# Patient Record
Sex: Female | Born: 2000 | Race: White | Hispanic: No | Marital: Single | Smoking: Never smoker
Health system: Southern US, Community
[De-identification: ages and names within clinical notes are randomized; demographics above are authoritative.]

---
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
## 2019-03-04 ENCOUNTER — Other Ambulatory Visit: Payer: Self-pay

## 2019-03-04 ENCOUNTER — Encounter: Payer: Self-pay | Admitting: Physician Assistant

## 2019-03-04 ENCOUNTER — Ambulatory Visit (LOCAL_COMMUNITY_HEALTH_CENTER): Payer: Self-pay | Admitting: Physician Assistant

## 2019-03-04 VITALS — BP 123/80 | Ht 63.0 in | Wt 106.0 lb

## 2019-03-04 DIAGNOSIS — Z30011 Encounter for initial prescription of contraceptive pills: Secondary | ICD-10-CM

## 2019-03-04 DIAGNOSIS — Z113 Encounter for screening for infections with a predominantly sexual mode of transmission: Secondary | ICD-10-CM

## 2019-03-04 DIAGNOSIS — Z3009 Encounter for other general counseling and advice on contraception: Secondary | ICD-10-CM

## 2019-03-04 DIAGNOSIS — Z3041 Encounter for surveillance of contraceptive pills: Secondary | ICD-10-CM

## 2019-03-04 LAB — WET PREP FOR TRICH, YEAST, CLUE
Trichomonas Exam: NEGATIVE
Yeast Exam: NEGATIVE

## 2019-03-04 MED ORDER — NORETHINDRONE ACET-ETHINYL EST 1-20 MG-MCG PO TABS: 1.0000 | ORAL_TABLET | Freq: Every day | ORAL | 0 refills | Status: AC

## 2019-03-04 NOTE — Progress Notes (Signed)
[employer] results reviewed. Per standing orders no treatment indicated. Ocp's given condoms declined. Hal Morales, RN

## 2019-03-04 NOTE — Progress Notes (Signed)
Pt here for STD screening and desires to get back on OCP's. Pt reports last time she used OCP's was back in 09/2018.Ronny Bacon, RN

## 2019-03-05 NOTE — Progress Notes (Signed)
Family Planning Visit-  Subjective:  Molly Hunt is a 18 y.o. being seen today for an well woman visit and to continue with her OCs.    She is currently using OCP (estrogen/progesterone) for pregnancy prevention. Patient reports she does not  want a pregnancy in the next year. Patient  does not have a problem list on file.  Chief Complaint  Patient presents with  . SEXUALLY TRANSMITTED DISEASE    std screening  . Contraception    OCPs    Patient reports that she is doing well with her current OCs and wants to continue with them.  States that she ran out and has been using condoms sometimes since then.  LMP 02/15/2019 and normal.  Last sex 03/01/2019 and without condoms.  Requests STD screening due to having a new partner and finding out that the ex was cheating. Denies any symptoms.   Patient denies any changes to personal and family history since last RP.  Denies any concerns today.   Does the patient desire a pregnancy in the next year? ([facility] flowsheet)  See flowsheet for other program required questions.   Body mass index is 18.78 kg/m. - Patient is eligible for diabetes screening based on BMI and age >26?  not applicable HA1C ordered? not applicable  Patient reports 2 of partners in last year. Desires STI screening?  Yes  Does the patient have a current or past history of drug use? No   No components found for: HCV]   Health Maintenance Due  Topic Date Due  . HIV Screening  04/24/2014  . TETANUS/TDAP  04/24/2018  . INFLUENZA VACCINE  11/30/2018    Review of Systems  All other systems reviewed and are negative.   The following portions of the patient's history were reviewed and updated as appropriate: allergies, current medications, past family history, past medical history, past social history, past surgical history and problem list. Problem list updated.  Objective:   Vitals:   03/04/19 1440  BP: 123/80  Weight: 106 lb (48.1 kg)  Height: 5\' 3"  (1.6 m)     Physical Exam Vitals signs reviewed.  Constitutional:      General: She is not in acute distress.    Appearance: Normal appearance.  HENT:     Head: Normocephalic and atraumatic.     Mouth/Throat:     Mouth: Mucous membranes are moist.     Pharynx: Oropharynx is clear. No oropharyngeal exudate or posterior oropharyngeal erythema.  Eyes:     Conjunctiva/sclera: Conjunctivae normal.  Neck:     Musculoskeletal: Neck supple.  Pulmonary:     Effort: Pulmonary effort is normal.  Abdominal:     Palpations: Abdomen is soft. There is no mass.     Tenderness: There is no abdominal tenderness. There is no guarding or rebound.  Genitourinary:    General: Normal vulva.     Rectum: Normal.     Comments: External genitalia/pubic area without nits, lice, edema, erythema, lesions and inguinal adenopathy. Vagina with normal mucosa and discharge, pH=4.5. Cervix without visible lesions. Uterus firm, mobile, nt, no masses, no CMT, no adnexal tenderness or fullness. Lymphadenopathy:     Cervical: No cervical adenopathy.  Skin:    General: Skin is warm and dry.     Findings: No bruising, erythema, lesion or rash.  Neurological:     Mental Status: She is alert and oriented to person, place, and time.  Psychiatric:        Mood and Affect: Mood  normal.        Behavior: Behavior normal.        Thought Content: Thought content normal.        Judgment: Judgment normal.       Assessment and Plan:  Molly Hunt is a 18 y.o. female presenting to the [facility] Department for an initial well woman exam/family planning visit  Contraception counseling: Reviewed all forms of birth control options available including abstinence; over the counter/barrier methods; hormonal contraceptive medication including pill, patch, ring, injection,contraceptive implant; hormonal and nonhormonal IUDs; permanent sterilization options including vasectomy and the various tubal sterilization modalities.  Risks and benefits reviewed.  Questions were answered.  Written information was also given to the patient to review.  Patient desires to restart OCs, this was prescribed for patient. She will follow up in  1 yr and prn for surveillance.  She was told to call with any further questions, or with any concerns about this method of contraception.  Emphasized use of condoms 100% of the time for STI prevention.  1. Encounter for counseling regarding contraception Reviewed with patient how to restart OCs and that if she restarts today, mid-cycle, she may have some irregular bleeding mid cycle on OC pack. Counseled re:  ECP and patient declines this today. Rec condoms with all sex for first cycle of OCs and enc always for STD protection. - norethindrone-ethinyl estradiol (MICROGESTIN) 1-20 MG-MCG tablet; Take 1 tablet by mouth daily.  Dispense: 13 Package; Refill: 0  2. Screening for STD (sexually transmitted disease) Patient is without symptoms today. Await test results.  Counseled that RN will call if needs to RTC for any treatment once results are back. - WET PREP FOR [place], YEAST, CLUE - Chlamydia/Gonorrhea [facility] - HIV [place] [facility] - Syphilis Serology,  Lab  3. Surveillance of previously prescribed contraceptive pill OK to restart OCs, Microgestin 1/20 Fe #13 1 po daily at the same time. - norethindrone-ethinyl estradiol (MICROGESTIN) 1-20 MG-MCG tablet; Take 1 tablet by mouth daily.  Dispense: 13 Package; Refill: 0     No follow-ups on file.  No future appointments.  Jerene Dilling, PA

## 2020-01-23 ENCOUNTER — Ambulatory Visit: Payer: Self-pay

## 2020-04-29 ENCOUNTER — Other Ambulatory Visit: Payer: Self-pay

## 2020-04-29 ENCOUNTER — Encounter: Payer: Self-pay | Admitting: Obstetrics & Gynecology

## 2020-04-29 ENCOUNTER — Other Ambulatory Visit (HOSPITAL_COMMUNITY)
Admission: RE | Admit: 2020-04-29 | Discharge: 2020-04-29 | Disposition: A | Discharge status: Home / Self Care | Payer: Medicaid Other | Source: Ambulatory Visit | Attending: Obstetrics & Gynecology | Admitting: Obstetrics & Gynecology

## 2020-04-29 ENCOUNTER — Ambulatory Visit (INDEPENDENT_AMBULATORY_CARE_PROVIDER_SITE_OTHER): Payer: 59 | Admitting: Obstetrics & Gynecology

## 2020-04-29 VITALS — BP 100/60 | Ht 62.0 in | Wt 111.0 lb

## 2020-04-29 DIAGNOSIS — Z01419 Encounter for gynecological examination (general) (routine) without abnormal findings: Secondary | ICD-10-CM | POA: Diagnosis not present

## 2020-04-29 DIAGNOSIS — Z124 Encounter for screening for malignant neoplasm of cervix: Secondary | ICD-10-CM

## 2020-04-29 DIAGNOSIS — Z113 Encounter for screening for infections with a predominantly sexual mode of transmission: Secondary | ICD-10-CM | POA: Diagnosis not present

## 2020-04-29 NOTE — Patient Instructions (Addendum)
Tranexamic acid oral tablets What is this medicine? TRANEXAMIC ACID (TRAN ex AM ik AS id) slows down or stops blood clots from being broken down. This medicine is used to treat heavy monthly menstrual bleeding. This medicine may be used for other purposes; ask your health care provider or pharmacist if you have questions. COMMON BRAND NAME(S): Cyklokapron, Lysteda What should I tell my health care provider before I take this medicine? They need to know if you have any of these conditions:  bleeding in the brain  blood clotting problems  kidney disease  vision problems  an unusual allergic reaction to tranexamic acid, other medicines, foods, dyes, or preservatives  pregnant or trying to get pregnant  breast-feeding How should I use this medicine? Take this medicine by mouth with a glass of water. Follow the directions on the prescription label. Do not cut, crush, or chew this medicine. You can take it with or without food. If it upsets your stomach, take it with food. Take your medicine at regular intervals. Do not take it more often than directed. Do not stop taking except on your doctor's advice. Do not take this medicine until your period has started. Do not take it for more than 5 days in a row. Do not take this medicine when you do not have your period. Talk to your pediatrician regarding the use of this medicine in children. While this drug may be prescribed for female children as young as 12 years of age for selected conditions, precautions do apply. Overdosage: If you think you have taken too much of this medicine contact a poison control center or emergency room at once. NOTE: This medicine is only for you. Do not share this medicine with others. What if I miss a dose? If you miss a dose, take it when you remember, and then take your next dose at least 6 hours later. Do not take more than 2 tablets at a time to make up for missed doses. What may interact with this medicine? Do  not take this medicine with any of the following medications:  estrogens  birth control pills, patches, injections, rings or other devices that contain both an estrogen and a progestin This medicine may also interact with the following medications:  certain medicines used to help your blood clot  tretinoin (taken by mouth) This list may not describe all possible interactions. Give your health care provider a list of all the medicines, herbs, non-prescription drugs, or dietary supplements you use. Also tell them if you smoke, drink alcohol, or use illegal drugs. Some items may interact with your medicine. What should I watch for while using this medicine? Tell your doctor or healthcare professional if your symptoms do not start to get better or if they get worse. Tell your doctor or healthcare professional if you notice any eye problems while taking this medicine. Your doctor will refer you to an eye doctor who will examine your eyes. What side effects may I notice from receiving this medicine? Side effects that you should report to your doctor or health care professional as soon as possible:  allergic reactions like skin rash, itching or hives, swelling of the face, lips, or tongue  breathing difficulties  changes in vision  sudden or severe pain in the chest, legs, head, or groin  unusually weak or tired Side effects that usually do not require medical attention (report to your doctor or health care professional if they continue or are bothersome):  back pain    headache  muscle or joint aches  sinus and nasal problems  stomach pain  tiredness This list may not describe all possible side effects. Call your doctor for medical advice about side effects. You may report side effects to FDA at 1-800-FDA-1088. Where should I keep my medicine? Keep out of the reach of children. Store at room temperature between 15 and 30 degrees C (59 and 86 degrees F). Throw away any unused  medicine after the expiration date. NOTE: This sheet is a summary. It may not cover all possible information. If you have questions about this medicine, talk to your doctor, pharmacist, or health care provider.  2020 Elsevier/Gold Standard (2015-05-20 09:12:15)  

## 2020-04-29 NOTE — Progress Notes (Signed)
HPI:      Ms. Atlee Villers is a 19 y.o. No obstetric history on file. who LMP was Patient's last menstrual period was 04/15/2020., she presents today for her annual examination. The patient has no complaints today. The patient is sexually active. Her no prior history of gyn screening tests. The patient does not perform self breast exams.  There is no notable family history of breast or ovarian cancer in her family.  The patient has regular exercise: yes.  The patient denies current symptoms of depression.    GYN History: Contraception: condoms  OCP in past- side effects Does not desire other hormonal contraception Reports irreg and heavy/painful period first day especially    Also assoc w breast T  PMHx: History reviewed. No pertinent past medical history. History reviewed. No pertinent surgical history. Family History  Family history unknown: Yes   Social History   Tobacco Use  . Smoking status: Never Smoker  . Smokeless tobacco: Never Used  Substance Use Topics  . Alcohol use: Yes  . Drug use: Never    Current Outpatient Medications:  .  norethindrone-ethinyl estradiol (MICROGESTIN) 1-20 MG-MCG tablet, Take 1 tablet by mouth daily. (Patient not taking: Reported on 04/29/2020), Disp: 13 Package, Rfl: 0 Allergies: Bee venom and Aspirin  Review of Systems  Constitutional: Negative for chills, fever and malaise/fatigue.  HENT: Negative for congestion, sinus pain and sore throat.   Eyes: Negative for blurred vision and pain.  Respiratory: Negative for cough and wheezing.   Cardiovascular: Negative for chest pain and leg swelling.  Gastrointestinal: Negative for abdominal pain, constipation, diarrhea, heartburn, nausea and vomiting.  Genitourinary: Negative for dysuria, frequency, hematuria and urgency.  Musculoskeletal: Negative for back pain, joint pain, myalgias and neck pain.  Skin: Negative for itching and rash.  Neurological: Negative for dizziness, tremors and weakness.   Endo/Heme/Allergies: Does not bruise/bleed easily.  Psychiatric/Behavioral: Negative for depression. The patient is not nervous/anxious and does not have insomnia.     Objective: BP 100/60   Ht 5\' 2"  (1.575 m)   Wt 111 lb (50.3 kg)   LMP 04/15/2020   BMI 20.30 kg/m   Filed Weights   04/29/20 1434  Weight: 111 lb (50.3 kg)   Body mass index is 20.3 kg/m. Physical Exam Constitutional:      General: She is not in acute distress.    Appearance: She is well-developed and well-nourished.  Genitourinary:     Vagina, uterus and rectum normal.     There is no rash or lesion on the right labia.     There is no rash or lesion on the left labia.    No lesions in the vagina.     No vaginal bleeding.      Right Adnexa: not tender and no mass present.    Left Adnexa: not tender and no mass present.    No cervical motion tenderness, friability, lesion or polyp.     Uterus is mobile.     Uterus is not enlarged.     No uterine mass detected.    Uterus is midaxial.     Pelvic exam was performed with patient supine.  Breasts:     Right: No mass, skin change or tenderness.     Left: No mass, skin change or tenderness.    HENT:     Head: Normocephalic and atraumatic. No laceration.     Right Ear: Hearing normal.     Left Ear: Hearing normal.  Nose: No epistaxis or foreign body.     Mouth/Throat:     Mouth: Oropharynx is clear and moist and mucous membranes are normal.     Pharynx: Uvula midline.  Eyes:     Pupils: Pupils are equal, round, and reactive to light.  Neck:     Thyroid: No thyromegaly.  Cardiovascular:     Rate and Rhythm: Normal rate and regular rhythm.     Heart sounds: No murmur heard. No friction rub. No gallop.   Pulmonary:     Effort: Pulmonary effort is normal. No respiratory distress.     Breath sounds: Normal breath sounds. No wheezing.  Abdominal:     General: Bowel sounds are normal. There is no distension.     Palpations: Abdomen is soft.      Tenderness: There is no abdominal tenderness. There is no rebound.  Musculoskeletal:        General: Normal range of motion.     Cervical back: Normal range of motion and neck supple.  Neurological:     Mental Status: She is alert and oriented to person, place, and time.     Cranial Nerves: No cranial nerve deficit.  Skin:    General: Skin is warm and dry.  Psychiatric:        Mood and Affect: Mood and affect normal.        Judgment: Judgment normal.  Vitals reviewed.     Assessment:  ANNUAL EXAM 1. Women's annual routine gynecological examination   2. Screening for cervical cancer   3. Screen for STD (sexually transmitted disease)      Screening Plan:            1.  Cervical Screening-  Pap smear done today  2.  Labs Ordered today  3.  Heavy painful periods, does not desire hormones. Pros and cons of LYSTEDA discussed.  Concerned also about cost.  Will Rx and see if helps and/or is cost effective.  4. STD screening today, blood work and cultures w PAP  5. Counseling for contraception: condoms  The pregnancy intention screening data noted above was reviewed. Potential methods of contraception were discussed. The patient elected to proceed with Female Condom.      F/U  Return in about 1 year (around 04/29/2021) for Annual.  Annamarie Major, MD, [facility], [facility] 04/29/2020  2:54 PM

## 2020-05-05 LAB — RPR+HSVIGM+HBSAG+HSV2(IGG)+...
HIV Screen 4th Generation wRfx: NONREACTIVE
HSV 2 IgG, Type Spec: <0.91 index (ref 0.00–0.90)
HSVI/II Comb IgM: <0.91 Ratio (ref 0.00–0.90)
Hepatitis B Surface Ag: NEGATIVE
RPR Ser Ql: NONREACTIVE

## 2020-05-09 LAB — CYTOLOGY - PAP
Chlamydia: NEGATIVE
Comment: NEGATIVE
Comment: NEGATIVE
Comment: NEGATIVE
Comment: NORMAL
Diagnosis: UNDETERMINED — AB
High risk HPV: POSITIVE — AB
Neisseria Gonorrhea: NEGATIVE
Trichomonas: NEGATIVE

## 2020-05-11 ENCOUNTER — Telehealth: Payer: Self-pay

## 2020-05-11 NOTE — Telephone Encounter (Signed)
Appointment has been cancelled.

## 2020-05-11 NOTE — Telephone Encounter (Signed)
-----   Message from Nadara Mustard, MD sent at 05/11/2020  7:58 AM EST ----- Sch Colpo w PH.  Abnormal ASCUS and HPV PAP results and message in MyChart to patient w explanation and ned for colpo.

## 2020-05-11 NOTE — Progress Notes (Signed)
Sch Colpo w PH.  Abnormal ASCUS and HPV PAP results and message in MyChart to patient w explanation and ned for colpo.

## 2020-05-11 NOTE — Telephone Encounter (Signed)
Called patient- This was her first pap, ASCUS HPV+, ASCCP guidelines are for her to repeat her pap smear in 1 year. Discussed this with her. She will follow up in 1 year for a pap smear. Please cancel colposcopy appointment. Thank you
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
## 2020-05-28 ENCOUNTER — Ambulatory Visit: Payer: 59 | Admitting: Obstetrics and Gynecology

## 2021-07-22 ENCOUNTER — Emergency Department: Payer: Self-pay

## 2021-07-22 ENCOUNTER — Other Ambulatory Visit: Payer: Self-pay

## 2021-07-22 ENCOUNTER — Emergency Department
Admission: EM | Admit: 2021-07-22 | Discharge: 2021-07-22 | Disposition: A | Discharge status: Home / Self Care | Payer: Self-pay | Attending: Emergency Medicine | Admitting: Emergency Medicine

## 2021-07-22 DIAGNOSIS — W228XXA Striking against or struck by other objects, initial encounter: Secondary | ICD-10-CM | POA: Insufficient documentation

## 2021-07-22 DIAGNOSIS — S62339A Displaced fracture of neck of unspecified metacarpal bone, initial encounter for closed fracture: Secondary | ICD-10-CM

## 2021-07-22 DIAGNOSIS — M79641 Pain in right hand: Secondary | ICD-10-CM | POA: Insufficient documentation

## 2021-07-22 DIAGNOSIS — S62326A Displaced fracture of shaft of fifth metacarpal bone, right hand, initial encounter for closed fracture: Secondary | ICD-10-CM | POA: Insufficient documentation

## 2021-07-22 MED ORDER — HYDROCODONE-ACETAMINOPHEN 5-325 MG PO TABS: 1.0000 | ORAL_TABLET | Freq: Once | ORAL | Status: DC
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
## 2021-07-22 NOTE — ED Notes (Addendum)
RN went into room to discharge pt and pt was not in room. RN was unable to obtain vital signs and complete discharge teaching due to pt leaving.  ?

## 2021-07-22 NOTE — ED Notes (Signed)
Provider placed splint on pt.  ?

## 2021-07-22 NOTE — Discharge Instructions (Addendum)
Keep the splint in place. Apply ice over the dressing to reduce swelling and pain. Take OTC Tylenol and Motrin as needed for pain. Follow-up with Ortho for further fracture care.  ?
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
## 2022-09-28 IMAGING — CR DG HAND COMPLETE 3+V*R*
1 series · 3 of 3 positions shown · non-contrast
Comparison: None.

CLINICAL DATA: Injury, punched a counter, pain and swelling in
right hand and fifth metacarpal

EXAM:
RIGHT HAND - COMPLETE 3+ VIEW

[Series 1: dg hand complete right · 0.14mm/px · 3 of 3 slices shown]
[im 1/3]
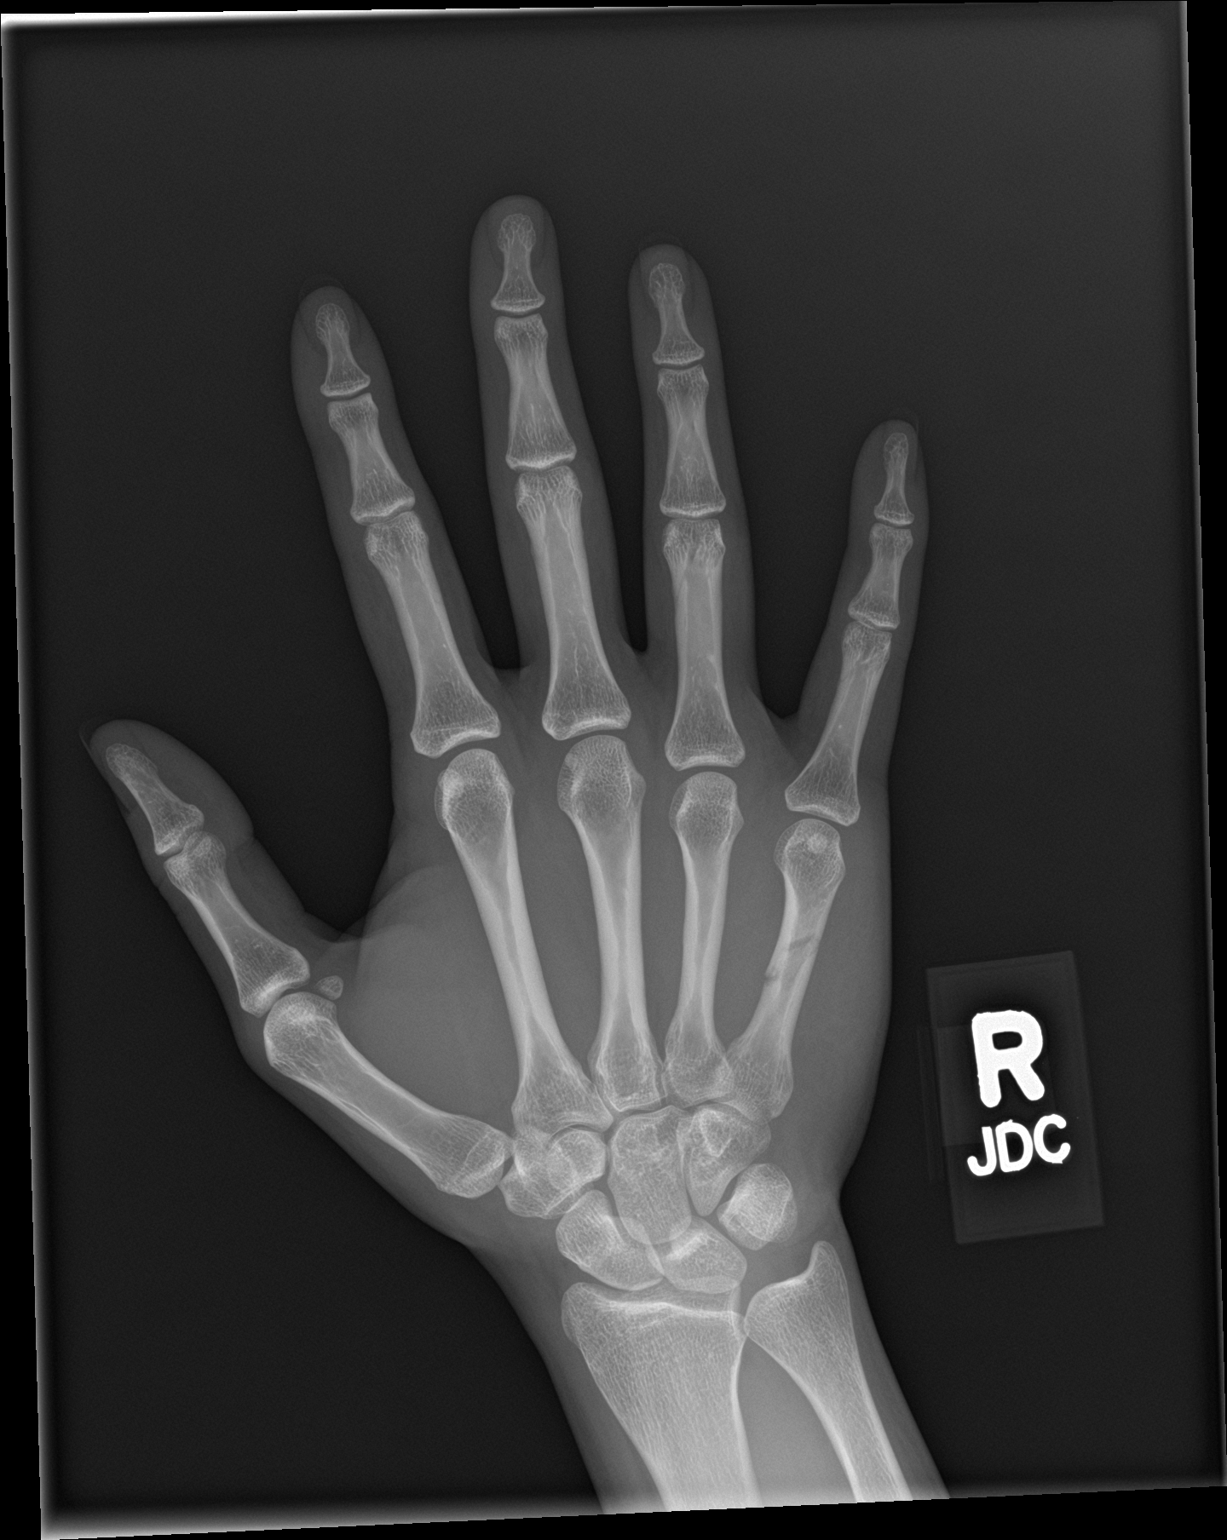
[im 2/3]
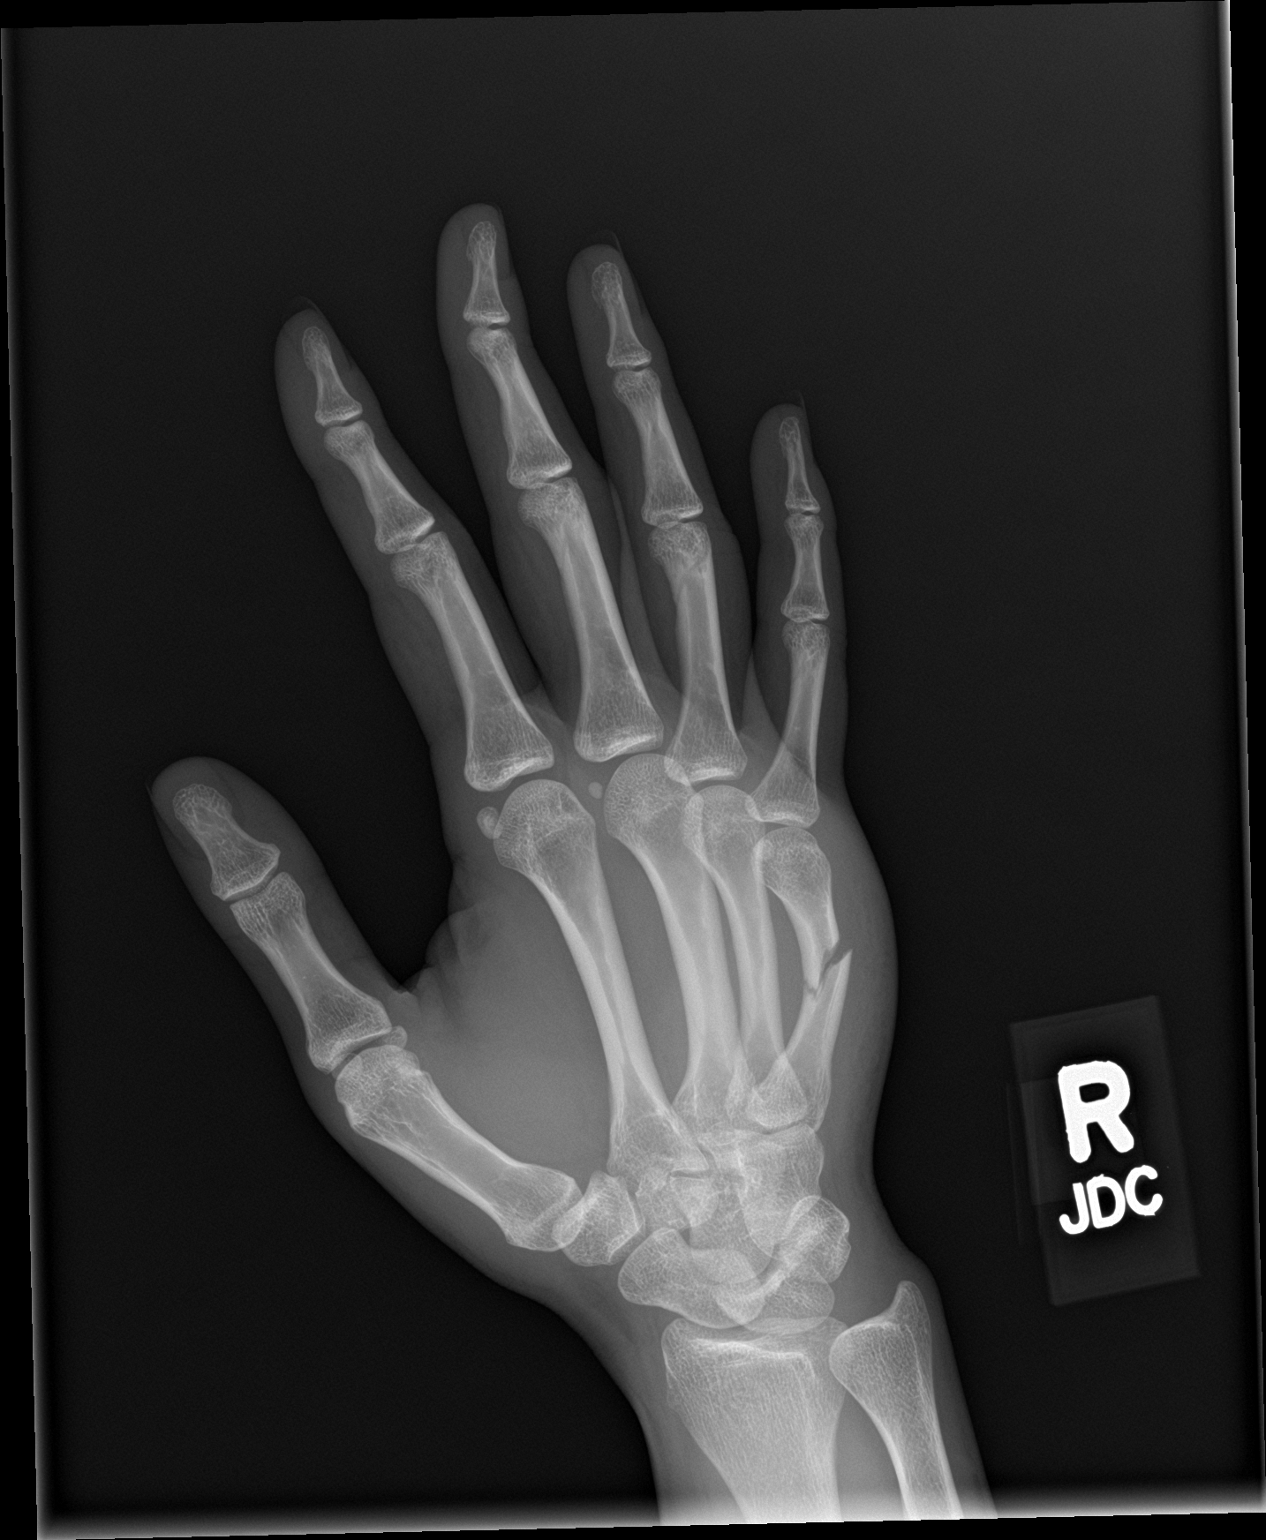
[im 3/3]
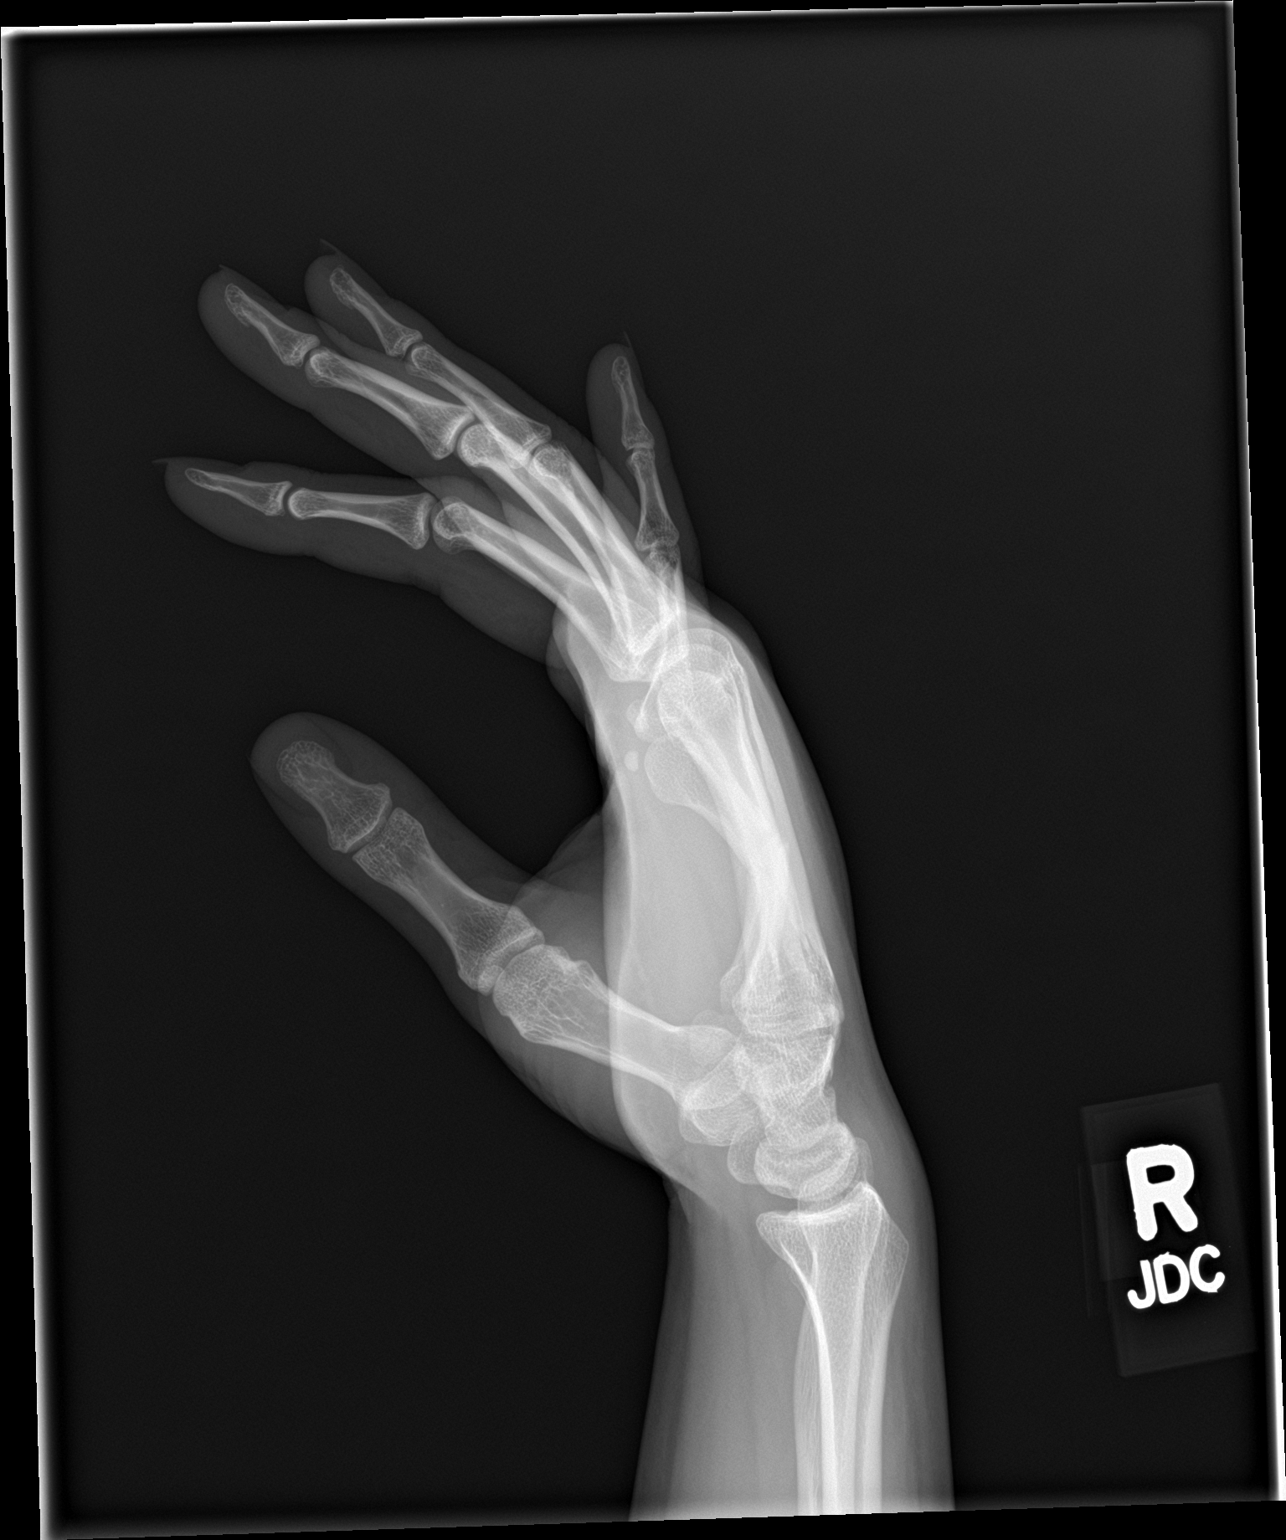

[3 of 3 positions shown; findings below may reference images not displayed]

FINDINGS: There is a volar angulated fifth metacarpal shaft fracture with
adjacent soft tissue swelling.
IMPRESSION: Volar angulated fifth metacarpal shaft fracture.

## 2023-08-14 ENCOUNTER — Emergency Department (HOSPITAL_COMMUNITY)
Admission: EM | Admit: 2023-08-14 | Discharge: 2023-08-14 | Disposition: A | Discharge status: Home / Self Care | Payer: Self-pay | Attending: Emergency Medicine | Admitting: Emergency Medicine

## 2023-08-14 ENCOUNTER — Emergency Department (HOSPITAL_COMMUNITY)

## 2023-08-14 ENCOUNTER — Other Ambulatory Visit: Payer: Self-pay

## 2023-08-14 DIAGNOSIS — Z23 Encounter for immunization: Secondary | ICD-10-CM | POA: Diagnosis not present

## 2023-08-14 DIAGNOSIS — S0083XA Contusion of other part of head, initial encounter: Secondary | ICD-10-CM | POA: Diagnosis not present

## 2023-08-14 DIAGNOSIS — M25512 Pain in left shoulder: Secondary | ICD-10-CM | POA: Insufficient documentation

## 2023-08-14 DIAGNOSIS — M25551 Pain in right hip: Secondary | ICD-10-CM | POA: Diagnosis present

## 2023-08-14 DIAGNOSIS — Y92410 Unspecified street and highway as the place of occurrence of the external cause: Secondary | ICD-10-CM | POA: Diagnosis not present

## 2023-08-14 LAB — I-STAT CHEM 8, ED
BUN: 13 mg/dL (ref 6–20)
Calcium, Ion: 1.17 mmol/L (ref 1.15–1.40)
Chloride: 106 mmol/L (ref 98–111)
Creatinine, Ser: 0.6 mg/dL (ref 0.44–1.00)
Glucose, Bld: 109 mg/dL — ABNORMAL HIGH (ref 70–99)
HCT: 38 % (ref 36.0–46.0)
Hemoglobin: 12.9 g/dL (ref 12.0–15.0)
Potassium: 3.7 mmol/L (ref 3.5–5.1)
Sodium: 140 mmol/L (ref 135–145)
TCO2: 23 mmol/L (ref 22–32)

## 2023-08-14 LAB — HCG, SERUM, QUALITATIVE: Preg, Serum: NEGATIVE

## 2023-08-14 LAB — URINALYSIS, ROUTINE W REFLEX MICROSCOPIC
Bilirubin Urine: NEGATIVE
Glucose, UA: NEGATIVE mg/dL
Hgb urine dipstick: NEGATIVE
Ketones, ur: NEGATIVE mg/dL
Leukocytes,Ua: NEGATIVE
Nitrite: NEGATIVE
Protein, ur: NEGATIVE mg/dL
Specific Gravity, Urine: >1.046 — ABNORMAL HIGH (ref 1.005–1.030)
pH: 6 (ref 5.0–8.0)

## 2023-08-14 LAB — CBC
HCT: 40.8 % (ref 36.0–46.0)
Hemoglobin: 14.1 g/dL (ref 12.0–15.0)
MCH: 32 pg (ref 26.0–34.0)
MCHC: 34.6 g/dL (ref 30.0–36.0)
MCV: 92.5 fL (ref 80.0–100.0)
Platelets: 300 10*3/uL (ref 150–400)
RBC: 4.41 MIL/uL (ref 3.87–5.11)
RDW: 12.1 % (ref 11.5–15.5)
WBC: 13.5 10*3/uL — ABNORMAL HIGH (ref 4.0–10.5)
nRBC: 0 % (ref 0.0–0.2)

## 2023-08-14 LAB — COMPREHENSIVE METABOLIC PANEL WITH GFR
ALT: 15 U/L (ref 0–44)
AST: 18 U/L (ref 15–41)
Albumin: 3.8 g/dL (ref 3.5–5.0)
Alkaline Phosphatase: 56 U/L (ref 38–126)
Anion gap: 9 (ref 5–15)
BUN: 12 mg/dL (ref 6–20)
CO2: 22 mmol/L (ref 22–32)
Calcium: 8.7 mg/dL — ABNORMAL LOW (ref 8.9–10.3)
Chloride: 108 mmol/L (ref 98–111)
Creatinine, Ser: 0.74 mg/dL (ref 0.44–1.00)
GFR, Estimated: >60 mL/min (ref >60)
Glucose, Bld: 114 mg/dL — ABNORMAL HIGH (ref 70–99)
Potassium: 3.6 mmol/L (ref 3.5–5.1)
Sodium: 139 mmol/L (ref 135–145)
Total Bilirubin: 1.4 mg/dL — ABNORMAL HIGH (ref 0.0–1.2)
Total Protein: 6.4 g/dL — ABNORMAL LOW (ref 6.5–8.1)

## 2023-08-14 LAB — SAMPLE TO BLOOD BANK

## 2023-08-14 LAB — PROTIME-INR
INR: 1.1 (ref 0.8–1.2)
Prothrombin Time: 13.9 s (ref 11.4–15.2)

## 2023-08-14 LAB — I-STAT CG4 LACTIC ACID, ED: Lactic Acid, Venous: 0.9 mmol/L (ref 0.5–1.9)

## 2023-08-14 LAB — ETHANOL: Alcohol, Ethyl (B): <10 mg/dL (ref <10)

## 2023-08-14 MED ORDER — METHOCARBAMOL 500 MG PO TABS
500.0000 mg | ORAL_TABLET | Freq: Once | ORAL | Status: AC
  Administered 2023-08-14: 500 mg via ORAL
  Filled 2023-08-14: qty 1

## 2023-08-14 MED ORDER — FENTANYL CITRATE PF 50 MCG/ML IJ SOSY
50.0000 ug | PREFILLED_SYRINGE | Freq: Once | INTRAMUSCULAR | Status: AC
  Administered 2023-08-14: 50 ug via INTRAVENOUS
  Filled 2023-08-14: qty 1

## 2023-08-14 MED ORDER — TETANUS-DIPHTH-ACELL PERTUSSIS 5-2.5-18.5 LF-MCG/0.5 IM SUSY
0.5000 mL | PREFILLED_SYRINGE | Freq: Once | INTRAMUSCULAR | Status: AC
  Administered 2023-08-14: 0.5 mL via INTRAMUSCULAR
  Filled 2023-08-14: qty 0.5

## 2023-08-14 MED ORDER — METHOCARBAMOL 500 MG PO TABS: 500.0000 mg | ORAL_TABLET | Freq: Three times a day (TID) | ORAL | 0 refills | Status: AC | PRN

## 2023-08-14 MED ORDER — IOHEXOL 350 MG/ML SOLN
75.0000 mL | Freq: Once | INTRAVENOUS | Status: AC | PRN
  Administered 2023-08-14: 75 mL via INTRAVENOUS

## 2023-08-14 MED ORDER — KETOROLAC TROMETHAMINE 15 MG/ML IJ SOLN
15.0000 mg | Freq: Once | INTRAMUSCULAR | Status: AC
  Administered 2023-08-14: 15 mg via INTRAVENOUS
  Filled 2023-08-14: qty 1
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
## 2023-08-14 NOTE — ED Notes (Signed)
 Patient transported to CT
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
